# Patient Record
Sex: Male | Born: 1995 | Race: White | Hispanic: Yes | Marital: Single | Smoking: Never smoker
Health system: Southern US, Community
[De-identification: ages and names within clinical notes are randomized; demographics above are authoritative.]

---
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
## 2017-04-28 ENCOUNTER — Encounter (HOSPITAL_COMMUNITY): Payer: Self-pay | Admitting: Emergency Medicine

## 2017-04-28 DIAGNOSIS — L600 Ingrowing nail: Secondary | ICD-10-CM | POA: Diagnosis not present

## 2017-04-28 DIAGNOSIS — Z79899 Other long term (current) drug therapy: Secondary | ICD-10-CM | POA: Diagnosis not present

## 2017-04-28 NOTE — ED Triage Notes (Signed)
Pt presents with L great toe swelling, redness, and warmth with purulent drainage for 4 months worse today; pt suspects ingrown toenail

## 2017-04-29 ENCOUNTER — Emergency Department (HOSPITAL_COMMUNITY)
Admission: EM | Admit: 2017-04-29 | Discharge: 2017-04-29 | Disposition: A | Discharge status: Home / Self Care | Payer: Medicaid Other | Attending: Emergency Medicine | Admitting: Emergency Medicine

## 2017-04-29 DIAGNOSIS — L600 Ingrowing nail: Secondary | ICD-10-CM

## 2017-04-29 MED ORDER — LIDOCAINE HCL 2 % IJ SOLN
20.0000 mL | Freq: Once | INTRAMUSCULAR | Status: AC
  Administered 2017-04-29: 400 mg
  Filled 2017-04-29: qty 20

## 2017-04-29 MED ORDER — CEPHALEXIN 500 MG PO CAPS: 500.0000 mg | ORAL_CAPSULE | Freq: Four times a day (QID) | ORAL | 0 refills | Status: DC

## 2017-04-29 NOTE — ED Provider Notes (Signed)
[facility] Provider Note   CSN: [id number] Arrival date & time: 04/28/17  2247     History   Chief Complaint Chief Complaint  Patient presents with  . Nail Problem    HPI Craig Chavez is a 21 y.o. male.  HPI   21 year old male presents today with complaints of toe pain. Patient reports over the last several months he's had swelling over the left great toe. He notes an ingrown toenail along the medial nail fold with swelling and purulent discharge. He notes minor amount of surrounding redness, denies any extension up into the joint remainder of foot. He denies any other infectious etiology including fever chills nausea or vomiting. No medications prior to arrival. No significant past medical history.  History reviewed. No pertinent past medical history.  There are no active problems to display for this patient.   History reviewed. No pertinent surgical history.     Home Medications    Prior to Admission medications   Medication Sig Start Date End Date Taking? Authorizing Provider  amoxicillin (AMOXIL) 875 MG tablet Take 1 tablet (875 mg total) by mouth 2 (two) times daily. 11/09/16   Deatra Canterxford, William J, FNP  cephALEXin (KEFLEX) 500 MG capsule Take 1 capsule (500 mg total) by mouth 4 (four) times daily. 04/29/17   Rayley Gao, Tinnie GensJeffrey, PA-C  ibuprofen (ADVIL,MOTRIN) 600 MG tablet Take 1 tablet (600 mg total) by mouth every 6 (six) hours as needed. 05/10/16   Maloy, Illene RegulusBrittany Nicole, NP  ipratropium (ATROVENT) 0.06 % nasal spray Place 2 sprays into both nostrils 4 (four) times daily. 11/09/16   Deatra Canterxford, William J, FNP  mupirocin ointment (BACTROBAN) 2 % Apply to affected area BID x 7-10 days 02/09/15   Presson, Mathis FareJennifer Lee H, PA  neomycin-polymyxin-hydrocortisone (CORTISPORIN) 3.5-10000-1 otic suspension Place 4 drops into both ears 3 (three) times daily. 11/09/16   Deatra Canterxford, William J, FNP    Family History History reviewed. No pertinent family history.  Social  History Social History  Substance Use Topics  . Smoking status: Never Smoker  . Smokeless tobacco: Never Used  . Alcohol use No     Allergies   Patient has no known allergies.   Review of Systems Review of Systems  All other systems reviewed and are negative.    Physical Exam Updated Vital Signs BP 121/80 (BP Location: Right Arm)   Pulse 61   Temp 97.8 F (36.6 C) (Oral)   Resp 14   Ht 5\' 6"  (1.676 m)   Wt 78 kg (172 lb)   SpO2 97%   BMI 27.76 kg/m   Physical Exam  Constitutional: He is oriented to person, place, and time. He appears well-developed and well-nourished.  HENT:  Head: Normocephalic and atraumatic.  Eyes: Pupils are equal, round, and reactive to light. Conjunctivae are normal. Right eye exhibits no discharge. Left eye exhibits no discharge. No scleral icterus.  Neck: Normal range of motion. No JVD present. No tracheal deviation present.  Pulmonary/Chest: Effort normal. No stridor.  Musculoskeletal:  Redness and chronic skin changes  noted along the left great toe medial nail fold, ingrown toenail noted very small amount of surrounding redness. No pain to palpation of the plantar aspect of the toe  Neurological: He is alert and oriented to person, place, and time. Coordination normal.  Psychiatric: He has a normal mood and affect. His behavior is normal. Judgment and thought content normal.  Nursing note and vitals reviewed.    ED Treatments / Results  Labs (all labs  ordered are listed, but only abnormal results are displayed) Labs Reviewed - No data to display  EKG  EKG Interpretation None       Radiology No results found.  Procedures .Nail Removal Date/Time: 04/29/2017 6:37 AM Performed by: Curlene DolphinHEDGES, Besan Ketchem Authorized by: Curlene DolphinHEDGES, Elisse Pennick   Consent:    Consent obtained:  Verbal   Consent given by:  Patient   Risks discussed:  Bleeding, incomplete removal, infection, pain and permanent nail deformity   Alternatives discussed:  Referral,  observation, alternative treatment, delayed treatment and no treatment Location:    Foot:  L big toe Pre-procedure details:    Skin preparation:  Betadine Anesthesia (see MAR for exact dosages):    Anesthesia method:  Nerve block   Block needle gauge:  25 G   Block anesthetic:  Lidocaine 1% w/o epi   Block technique:  Lateral    Block outcome:  Anesthesia achieved Nail Removal:    Nail removed:  Complete   Nail bed repaired: no     Removed nail replaced and anchored: no   Ingrown nail:    Wedge excision of skin: no   Post-procedure details:    Dressing:  Antibiotic ointment   Patient tolerance of procedure:  Tolerated well, no immediate complications   (including critical care time)  Medications Ordered in ED Medications  lidocaine (XYLOCAINE) 2 % (with pres) injection 400 mg (400 mg Infiltration Given 04/29/17 0417)     Initial Impression / Assessment and Plan / ED Course  I have reviewed the triage vital signs and the nursing notes.  Pertinent labs & imaging results that were available during my care of the patient were reviewed by me and considered in my medical decision making (see chart for details).      Final Clinical Impressions(s) / ED Diagnoses   Final diagnoses:  Ingrown toenail    21 year old male presents today with ingrown toenail. Patient had significant redness around the ingrown toe, no. Discharge. Wedge resection was done here without complication, patient tolerated this very well. He will placed on antibiotics, he will follow-up as an outpatient with podiatry for further evaluation and management. He is given strict return cautioned, he verbalized understanding and agreement to today's plan had no further questions or concerns at time of discharge.    New Prescriptions Discharge Medication List as of 04/29/2017  5:45 AM    START taking these medications   Details  cephALEXin (KEFLEX) 500 MG capsule Take 1 capsule (500 mg total) by mouth 4 (four)  times daily., Starting Sun 04/29/2017, Print         Jaymison Luber, ChumucklaJeffrey, PA-C 04/29/17 [id number]    Zadie RhineWickline, Donald, MD 04/30/17 [phone]

## 2017-04-29 NOTE — Discharge Instructions (Signed)
Please read attached information. If you experience any new or worsening signs or symptoms please return to the emergency room for evaluation. Please follow-up with your primary care provider or specialist as discussed. Please use medication prescribed only as directed and discontinue taking if you have any concerning signs or symptoms.   °

## 2017-04-30 ENCOUNTER — Telehealth: Payer: Self-pay | Admitting: *Deleted

## 2017-04-30 NOTE — Telephone Encounter (Signed)
Pharmacy called to get permission to fill out of state Rx.

## 2017-10-22 ENCOUNTER — Encounter (HOSPITAL_COMMUNITY): Payer: Self-pay

## 2017-10-22 ENCOUNTER — Ambulatory Visit (HOSPITAL_COMMUNITY)
Admission: EM | Admit: 2017-10-22 | Discharge: 2017-10-22 | Disposition: A | Discharge status: Home / Self Care | Payer: Medicaid Other | Attending: Family Medicine | Admitting: Family Medicine

## 2017-10-22 ENCOUNTER — Other Ambulatory Visit: Payer: Self-pay

## 2017-10-22 DIAGNOSIS — B9789 Other viral agents as the cause of diseases classified elsewhere: Secondary | ICD-10-CM

## 2017-10-22 DIAGNOSIS — R05 Cough: Secondary | ICD-10-CM | POA: Diagnosis not present

## 2017-10-22 DIAGNOSIS — J069 Acute upper respiratory infection, unspecified: Secondary | ICD-10-CM | POA: Diagnosis not present

## 2017-10-22 DIAGNOSIS — J029 Acute pharyngitis, unspecified: Secondary | ICD-10-CM

## 2017-10-22 MED ORDER — HYDROCODONE-HOMATROPINE 5-1.5 MG/5ML PO SYRP: 5.0000 mL | ORAL_SOLUTION | Freq: Four times a day (QID) | ORAL | 0 refills | Status: DC | PRN

## 2017-10-22 NOTE — ED Provider Notes (Signed)
  [facility]   [id number] 10/22/17 Arrival Time: 1903   SUBJECTIVE:  Riki Alteslan Herrera-Miranda is a 22 y.o. male who presents to the urgent care with complaint of sore throat and cough x 2 days.  No fever.  Pain with swallowing.  He works for [employer]  History reviewed. No pertinent past medical history. History reviewed. No pertinent family history. Social History   Socioeconomic History  . Marital status: Single    Spouse name: Not on file  . Number of children: Not on file  . Years of education: Not on file  . Highest education level: Not on file  Social Needs  . Financial resource strain: Not on file  . Food insecurity - worry: Not on file  . Food insecurity - inability: Not on file  . Transportation needs - medical: Not on file  . Transportation needs - non-medical: Not on file  Occupational History  . Not on file  Tobacco Use  . Smoking status: Never Smoker  . Smokeless tobacco: Never Used  Substance and Sexual Activity  . Alcohol use: No  . Drug use: No  . Sexual activity: No  Other Topics Concern  . Not on file  Social History Narrative  . Not on file   No outpatient medications have been marked as taking for the 10/22/17 encounter [facility](Hospital Encounter).   No Known Allergies    ROS: As per HPI, remainder of ROS negative.   OBJECTIVE:   Vitals:   10/22/17 2013  BP: (!) 144/79  Pulse: 61  Resp: 16  Temp: 98.1 F (36.7 C)  TempSrc: Oral  SpO2: 99%     General appearance: alert; no distress Eyes: PERRL; EOMI; conjunctiva normal HENT: normocephalic; atraumatic; TMs normal, canal normal, external ears normal without trauma; nasal mucosa normal; tonsils enlarged bilaterally Neck: supple Lungs: clear to auscultation bilaterally Heart: regular rate and rhythm Back: no CVA tenderness Extremities: no cyanosis or edema; symmetrical with no gross deformities Skin: warm and dry Neurologic: normal gait; grossly normal Psychological:  alert and cooperative; normal mood and affect    Labs:  Results for orders placed or performed during the hospital encounter of 02/02/16  Culture, group A strep  Result Value Ref Range   Specimen Description THROAT    Special Requests NONE    Culture NO GROUP A STREP (S.PYOGENES) ISOLATED    Report Status 02/05/2016 FINAL   POCT rapid strep A [facility]  Result Value Ref Range   Streptococcus, Group A Screen (Direct) NEGATIVE NEGATIVE      ASSESSMENT & PLAN:  1. Viral URI with cough     Meds ordered this encounter  Medications  . HYDROcodone-homatropine (HYCODAN) 5-1.5 MG/5ML syrup    Sig: Take 5 mLs by mouth every 6 (six) hours as needed for cough.    Dispense:  120 mL    Refill:  0    Reviewed expectations re: course of current medical issues. Questions answered. Outlined signs and symptoms indicating need for more acute intervention. Patient verbalized understanding. After Visit Summary given.      Elvina SidleLauenstein, Woodie Degraffenreid, MD 10/22/17 2039

## 2017-10-22 NOTE — Discharge Instructions (Signed)
The strep test is negative and all your symptoms and exam point to a viral infection.  I prescribed a cough medicine that should help control symptoms until your body fights off the viral infection.

## 2017-10-22 NOTE — ED Triage Notes (Signed)
[facility], pt has taken OTC medications but has no relief

## 2020-09-29 DIAGNOSIS — M7918 Myalgia, other site: Secondary | ICD-10-CM | POA: Diagnosis present

## 2020-09-29 DIAGNOSIS — U071 COVID-19: Secondary | ICD-10-CM | POA: Diagnosis not present

## 2020-09-30 ENCOUNTER — Emergency Department (HOSPITAL_COMMUNITY): Payer: Medicaid Other

## 2020-09-30 ENCOUNTER — Encounter (HOSPITAL_COMMUNITY): Payer: Self-pay | Admitting: *Deleted

## 2020-09-30 ENCOUNTER — Other Ambulatory Visit: Payer: Self-pay

## 2020-09-30 ENCOUNTER — Emergency Department (HOSPITAL_COMMUNITY)
Admission: EM | Admit: 2020-09-30 | Discharge: 2020-09-30 | Disposition: A | Discharge status: Home / Self Care | Payer: Medicaid Other | Attending: Emergency Medicine | Admitting: Emergency Medicine

## 2020-09-30 DIAGNOSIS — U071 COVID-19: Secondary | ICD-10-CM

## 2020-09-30 LAB — URINALYSIS, ROUTINE W REFLEX MICROSCOPIC
Bacteria, UA: NONE SEEN
Bilirubin Urine: NEGATIVE
Glucose, UA: NEGATIVE mg/dL
Ketones, ur: NEGATIVE mg/dL
Leukocytes,Ua: NEGATIVE
Nitrite: NEGATIVE
Protein, ur: NEGATIVE mg/dL
Specific Gravity, Urine: 1.02 (ref 1.005–1.030)
pH: 5 (ref 5.0–8.0)

## 2020-09-30 LAB — COMPREHENSIVE METABOLIC PANEL
ALT: 34 U/L (ref 0–44)
AST: 21 U/L (ref 15–41)
Albumin: 4.3 g/dL (ref 3.5–5.0)
Alkaline Phosphatase: 61 U/L (ref 38–126)
Anion gap: 12 (ref 5–15)
BUN: 10 mg/dL (ref 6–20)
CO2: 20 mmol/L — ABNORMAL LOW (ref 22–32)
Calcium: 9.1 mg/dL (ref 8.9–10.3)
Chloride: 102 mmol/L (ref 98–111)
Creatinine, Ser: 0.93 mg/dL (ref 0.61–1.24)
GFR, Estimated: >60 mL/min (ref >60)
Glucose, Bld: 110 mg/dL — ABNORMAL HIGH (ref 70–99)
Potassium: 3.8 mmol/L (ref 3.5–5.1)
Sodium: 134 mmol/L — ABNORMAL LOW (ref 135–145)
Total Bilirubin: 0.8 mg/dL (ref 0.3–1.2)
Total Protein: 7.8 g/dL (ref 6.5–8.1)

## 2020-09-30 LAB — RESP PANEL BY RT-PCR (FLU A&B, COVID) ARPGX2
Influenza A by PCR: NEGATIVE
Influenza B by PCR: NEGATIVE
SARS Coronavirus 2 by RT PCR: POSITIVE — AB

## 2020-09-30 LAB — CBC
HCT: 39.6 % (ref 39.0–52.0)
Hemoglobin: 13.7 g/dL (ref 13.0–17.0)
MCH: 30.6 pg (ref 26.0–34.0)
MCHC: 34.6 g/dL (ref 30.0–36.0)
MCV: 88.6 fL (ref 80.0–100.0)
Platelets: 287 10*3/uL (ref 150–400)
RBC: 4.47 MIL/uL (ref 4.22–5.81)
RDW: 12.2 % (ref 11.5–15.5)
WBC: 8.4 10*3/uL (ref 4.0–10.5)
nRBC: 0 % (ref 0.0–0.2)

## 2020-09-30 LAB — LIPASE, BLOOD: Lipase: 25 U/L (ref 11–51)

## 2020-09-30 MED ORDER — NAPROXEN 500 MG PO TABS: 500.0000 mg | ORAL_TABLET | Freq: Two times a day (BID) | ORAL | 0 refills | Status: DC

## 2020-09-30 MED ORDER — ACETAMINOPHEN 325 MG PO TABS
650.0000 mg | ORAL_TABLET | Freq: Once | ORAL | Status: AC
  Administered 2020-09-30: 650 mg via ORAL
  Filled 2020-09-30: qty 2

## 2020-09-30 MED ORDER — BENZONATATE 100 MG PO CAPS: 100.0000 mg | ORAL_CAPSULE | Freq: Three times a day (TID) | ORAL | 0 refills | Status: DC

## 2020-09-30 NOTE — Discharge Instructions (Addendum)
Your Covid test today was positive. Take the medications as needed to help with your symptoms. Follow-up with the post Covid care clinic listed below. Return to the ER if you start to experience chest pain, shortness of breath, uncontrollable vomiting.

## 2020-09-30 NOTE — ED Triage Notes (Signed)
The pt has had a temp headache body aches for 3 days

## 2020-09-30 NOTE — ED Provider Notes (Signed)
[facility] EMERGENCY DEPARTMENT Provider Note   CSN: [id number] Arrival date & time: 09/29/20  2307     History Chief Complaint  Patient presents with  . Generalized Body Aches    Craig Chavez is a 25 y.o. male who presents to ED with a chief complaint of body aches, cough and diarrhea for the past 3 days.  Denies any known Covid exposures but has not been vaccinated.  He has not been take any medication to help with his symptoms.  Denies any chronic lung disease.  Denies any chest pain, abdominal pain, hemoptysis, leg swelling or vomiting.  HPI     History reviewed. No pertinent past medical history.  There are no problems to display for this patient.   History reviewed. No pertinent surgical history.     No family history on file.  Social History   Tobacco Use  . Smoking status: Never Smoker  . Smokeless tobacco: Never Used  Substance Use Topics  . Alcohol use: No  . Drug use: No    Home Medications Prior to Admission medications   Medication Sig Start Date End Date Taking? Authorizing Provider  benzonatate (TESSALON) 100 MG capsule Take 1 capsule (100 mg total) by mouth every 8 (eight) hours. 09/30/20  Yes Tranell Wojtkiewicz, PA-C  naproxen (NAPROSYN) 500 MG tablet Take 1 tablet (500 mg total) by mouth 2 (two) times daily. 09/30/20  Yes Morton Simson, PA-C  HYDROcodone-homatropine (HYCODAN) 5-1.5 MG/5ML syrup Take 5 mLs by mouth every 6 (six) hours as needed for cough. 10/22/17   Robyn Haber, MD  ipratropium (ATROVENT) 0.06 % nasal spray Place 2 sprays into both nostrils 4 (four) times daily. 11/09/16   Lysbeth Penner, FNP    Allergies    Patient has no known allergies.  Review of Systems   Review of Systems  Constitutional: Positive for chills. Negative for appetite change and fever.  HENT: Negative for ear pain, rhinorrhea, sneezing and sore throat.   Eyes: Negative for photophobia and visual disturbance.  Respiratory: Positive for  cough. Negative for chest tightness, shortness of breath and wheezing.   Cardiovascular: Negative for chest pain and palpitations.  Gastrointestinal: Positive for diarrhea. Negative for abdominal pain, blood in stool, constipation, nausea and vomiting.  Genitourinary: Negative for dysuria, hematuria and urgency.  Musculoskeletal: Positive for myalgias.  Skin: Negative for rash.  Neurological: Negative for dizziness, weakness and light-headedness.    Physical Exam Updated Vital Signs BP 134/76   Pulse 85   Temp 98.5 F (36.9 C) (Oral)   Resp 19   Ht 5\' 6"  (1.676 m)   Wt 88.5 kg   SpO2 98%   BMI 31.47 kg/m   Physical Exam Vitals and nursing note reviewed.  Constitutional:      General: He is not in acute distress.    Appearance: He is well-developed and well-nourished.     Comments: Speaking in complete sentences without difficulty.  HENT:     Head: Normocephalic and atraumatic.     Nose: Nose normal.  Eyes:     General: No scleral icterus.       Left eye: No discharge.     Extraocular Movements: EOM normal.     Conjunctiva/sclera: Conjunctivae normal.  Cardiovascular:     Rate and Rhythm: Normal rate and regular rhythm.     Pulses: Intact distal pulses.     Heart sounds: Normal heart sounds. No murmur heard. No friction rub. No gallop.   Pulmonary:  Effort: Pulmonary effort is normal. No respiratory distress.     Breath sounds: Normal breath sounds.  Abdominal:     General: Bowel sounds are normal. There is no distension.     Palpations: Abdomen is soft.     Tenderness: There is no abdominal tenderness. There is no guarding.  Musculoskeletal:        General: No edema. Normal range of motion.     Cervical back: Normal range of motion and neck supple.  Skin:    General: Skin is warm and dry.     Findings: No rash.  Neurological:     Mental Status: He is alert.     Motor: No abnormal muscle tone.     Coordination: Coordination normal.  Psychiatric:         Mood and Affect: Mood and affect normal.     ED Results / Procedures / Treatments   Labs (all labs ordered are listed, but only abnormal results are displayed) Labs Reviewed  RESP PANEL BY RT-PCR (FLU A&B, COVID) ARPGX2 - Abnormal; Notable for the following components:      Result Value   SARS Coronavirus 2 by RT PCR POSITIVE (*)    All other components within normal limits  COMPREHENSIVE METABOLIC PANEL - Abnormal; Notable for the following components:   Sodium 134 (*)    CO2 20 (*)    Glucose, Bld 110 (*)    All other components within normal limits  URINALYSIS, ROUTINE W REFLEX MICROSCOPIC - Abnormal; Notable for the following components:   Hgb urine dipstick MODERATE (*)    All other components within normal limits  LIPASE, BLOOD  CBC    EKG None  Radiology DG Chest Portable 1 View  Result Date: 09/30/2020 CLINICAL DATA:  COVID.  Shortness of breath. EXAM: PORTABLE CHEST 1 VIEW COMPARISON:  No prior. FINDINGS: Mediastinum and hilar structures normal. Low lung volumes. No focal infiltrate. No pleural effusion or pneumothorax. Heart size normal. No pulmonary venous congestion. No acute bony abnormality. Mild thoracic spine scoliosis. IMPRESSION: Low lung volumes. No acute cardiopulmonary disease identified. Electronically Signed   By: Maisie Fus  Register   On: 09/30/2020 12:58    Procedures Procedures (including critical care time)  Medications Ordered in ED Medications  acetaminophen (TYLENOL) tablet 650 mg (650 mg Oral Given 09/30/20 0138)  acetaminophen (TYLENOL) tablet 650 mg (650 mg Oral Given 09/30/20 0847)    ED Course  I have reviewed the triage vital signs and the nursing notes.  Pertinent labs & imaging results that were available during my care of the patient were reviewed by me and considered in my medical decision making (see chart for details).    MDM Rules/Calculators/A&P                          Craig Chavez was evaluated in Emergency Department on  09/30/20 for the symptoms described in the history of present illness. He/she was evaluated in the context of the global COVID-19 pandemic, which necessitated consideration that the patient might be at risk for infection with the SARS-CoV-2 virus that causes COVID-19. Institutional protocols and algorithms that pertain to the evaluation of patients at risk for COVID-19 are in a state of rapid change based on information released by regulatory bodies including the CDC and federal and state organizations. These policies and algorithms were followed during the patient's care in the ED.  25 year old male presenting to the ED for cough, body aches, diarrhea  and chills for the past 3 days.  He is Covid positive here in the ED.  He denies any known exposures but has not been vaccinated.  CMP, CBC and lipase unremarkable.  He is hemodynamically stable, oxygen saturations above 97% on room air.  Will order chest x-ray and ambulate while checking pulse oximetry and reassess.  Chest x-ray without any acute abnormalities.  Patient ambulating here with oxygen saturations 100% on room air.  Will give information for post Covid care clinic and treat symptomatically.  Patient advised to return for any worsening symptoms.  All imaging, if done today, including plain films, CT scans, and ultrasounds, independently reviewed by me, and interpretations confirmed via formal radiology reads.  Patient is hemodynamically stable, in NAD, and able to ambulate in the ED. Evaluation does not show pathology that would require ongoing emergent intervention or inpatient treatment. I explained the diagnosis to the patient. Pain has been managed and has no complaints prior to discharge. Patient is comfortable with above plan and is stable for discharge at this time. All questions were answered prior to disposition. Strict return precautions for returning to the ED were discussed. Encouraged follow up with PCP.   An After Visit Summary was  printed and given to the patient.   Portions of this note were generated with Scientist, clinical (histocompatibility and immunogenetics). Dictation errors may occur despite best attempts at proofreading.  Final Clinical Impression(s) / ED Diagnoses Final diagnoses:  COVID-19 virus infection    Rx / DC Orders ED Discharge Orders         Ordered    naproxen (NAPROSYN) 500 MG tablet  2 times daily        09/30/20 1324    benzonatate (TESSALON) 100 MG capsule  Every 8 hours        09/30/20 1324           Dietrich Pates, PA-C 09/30/20 1349    Long, Arlyss Repress, MD 10/01/20 [phone]

## 2020-09-30 NOTE — ED Notes (Signed)
Pt ambulated on room air- sats 100%

## 2022-08-21 IMAGING — DX DG CHEST 1V PORT
1 series · 1 of 1 positions shown · non-contrast
Comparison: No prior.

CLINICAL DATA: COVID.  Shortness of breath.

EXAM:
PORTABLE CHEST 1 VIEW

[chest ap]
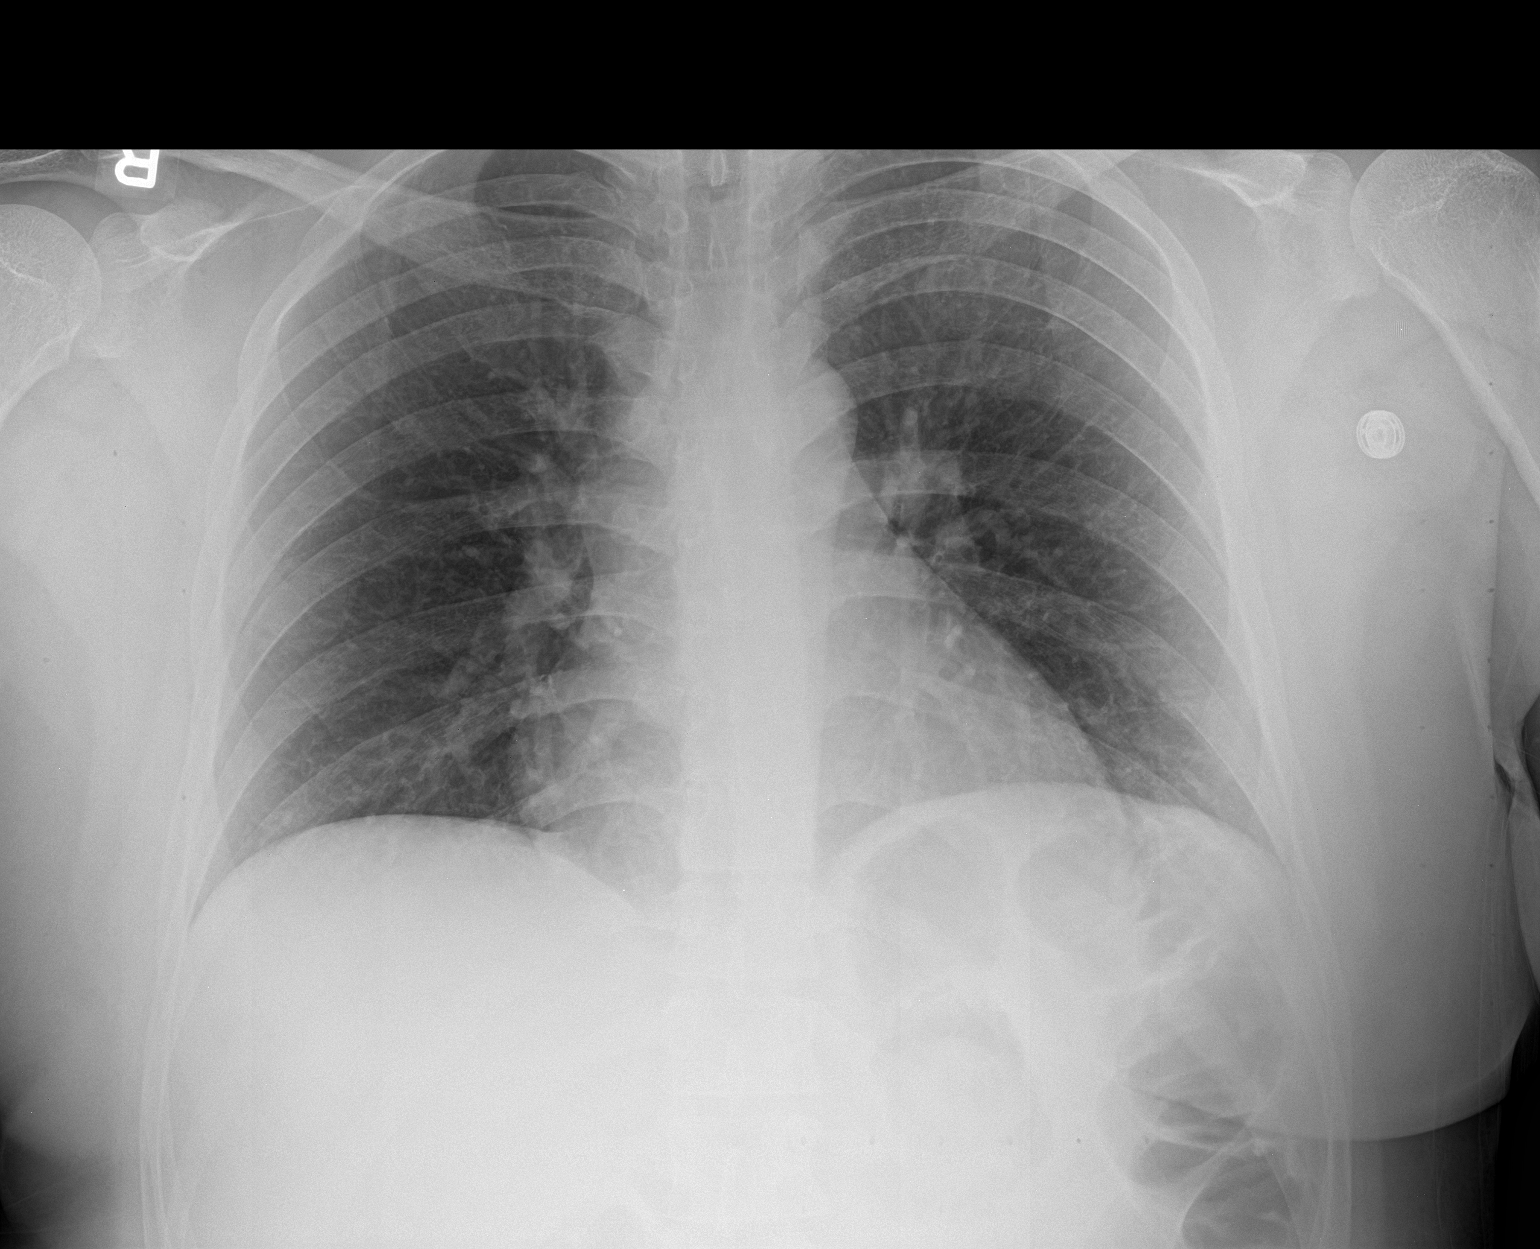

[1 of 1 positions shown; findings below may reference images not displayed]

FINDINGS: Mediastinum and hilar structures normal. Low lung volumes. No focal
infiltrate. No pleural effusion or pneumothorax. Heart size normal.
No pulmonary venous congestion. No acute bony abnormality. Mild
thoracic spine scoliosis.
IMPRESSION: Low lung volumes. No acute cardiopulmonary disease identified.

## 2023-07-06 ENCOUNTER — Emergency Department (HOSPITAL_COMMUNITY)
Admission: EM | Admit: 2023-07-06 | Discharge: 2023-07-07 | Discharge status: Against Medical Advice | Payer: Medicaid Other | Attending: Emergency Medicine | Admitting: Emergency Medicine

## 2023-07-06 ENCOUNTER — Other Ambulatory Visit: Payer: Self-pay

## 2023-07-06 ENCOUNTER — Encounter (HOSPITAL_COMMUNITY): Payer: Self-pay | Admitting: *Deleted

## 2023-07-06 ENCOUNTER — Emergency Department (HOSPITAL_COMMUNITY): Payer: Medicaid Other

## 2023-07-06 DIAGNOSIS — M79675 Pain in left toe(s): Secondary | ICD-10-CM | POA: Diagnosis present

## 2023-07-06 DIAGNOSIS — Z5321 Procedure and treatment not carried out due to patient leaving prior to being seen by health care provider: Secondary | ICD-10-CM | POA: Insufficient documentation

## 2023-07-06 LAB — COMPREHENSIVE METABOLIC PANEL
ALT: 37 U/L (ref 0–44)
AST: 19 U/L (ref 15–41)
Albumin: 4 g/dL (ref 3.5–5.0)
Alkaline Phosphatase: 64 U/L (ref 38–126)
Anion gap: 9 (ref 5–15)
BUN: 10 mg/dL (ref 6–20)
CO2: 23 mmol/L (ref 22–32)
Calcium: 9.3 mg/dL (ref 8.9–10.3)
Chloride: 106 mmol/L (ref 98–111)
Creatinine, Ser: 0.78 mg/dL (ref 0.61–1.24)
GFR, Estimated: >60 mL/min (ref >60)
Glucose, Bld: 124 mg/dL — ABNORMAL HIGH (ref 70–99)
Potassium: 3.7 mmol/L (ref 3.5–5.1)
Sodium: 138 mmol/L (ref 135–145)
Total Bilirubin: 0.5 mg/dL (ref 0.3–1.2)
Total Protein: 7.3 g/dL (ref 6.5–8.1)

## 2023-07-06 LAB — CBC
HCT: 42.4 % (ref 39.0–52.0)
Hemoglobin: 14.1 g/dL (ref 13.0–17.0)
MCH: 29.8 pg (ref 26.0–34.0)
MCHC: 33.3 g/dL (ref 30.0–36.0)
MCV: 89.6 fL (ref 80.0–100.0)
Platelets: 284 10*3/uL (ref 150–400)
RBC: 4.73 MIL/uL (ref 4.22–5.81)
RDW: 12.1 % (ref 11.5–15.5)
WBC: 8.8 10*3/uL (ref 4.0–10.5)
nRBC: 0 % (ref 0.0–0.2)

## 2023-07-06 NOTE — ED Notes (Signed)
Pt going to x-ray from [facility].

## 2023-07-06 NOTE — ED Triage Notes (Signed)
The pt has pain in his lt great toe  he has had the nail removed once because he had an ingrown toenail.Marland Kitchen this time the toenail has been painful for 2 months  the toe is discolored now and more painful

## 2023-07-07 NOTE — ED Notes (Signed)
This pt left said they were just going to go to urgent care in the morning since they had a child at home and couldn't stay much longer

## 2023-07-08 ENCOUNTER — Encounter (HOSPITAL_COMMUNITY): Payer: Self-pay | Admitting: Emergency Medicine

## 2023-07-08 ENCOUNTER — Ambulatory Visit (HOSPITAL_COMMUNITY)
Admission: EM | Admit: 2023-07-08 | Discharge: 2023-07-08 | Disposition: A | Discharge status: Home / Self Care | Payer: Medicaid Other | Attending: Internal Medicine | Admitting: Internal Medicine

## 2023-07-08 DIAGNOSIS — M869 Osteomyelitis, unspecified: Secondary | ICD-10-CM

## 2023-07-08 DIAGNOSIS — L600 Ingrowing nail: Secondary | ICD-10-CM

## 2023-07-08 MED ORDER — DOXYCYCLINE HYCLATE 100 MG PO CAPS
100.0000 mg | ORAL_CAPSULE | Freq: Two times a day (BID) | ORAL | 0 refills | Status: AC
Start: 2023-07-08 — End: 2023-07-22

## 2023-07-08 NOTE — ED Provider Notes (Signed)
[facility]    CSN: [id number] Arrival date & time: 07/08/23  1455      History   Chief Complaint Chief Complaint  Patient presents with   Toe Pain    Left great toe    HPI Craig Chavez is a 27 y.o. male.   Patient presents to urgent care for evaluation of pain to the left great toe that started approximately 1 year ago.  Over the last several days, the pain has intensified and is worse with weightbearing activity/movement of the left great toe.  He states the left great toenail appears ingrown and has been red, swollen, and tender over the last few months.  He has been trying to take out the ingrown toenail himself unsuccessfully at home.  No recent trauma/injuries to the left great toe, history of diabetes, or history of immunosuppression.  Denies fever, chills, nausea, vomiting, and bodyaches.  He went to the emergency department 2 days ago for evaluation of symptoms but left prior to being seen.  An x-ray was performed at the ER and so his blood work.  Blood work was unremarkable.  X-ray resulted and shows concerns for mild osteomyelitis to the distal tuft of the left great toe.  Patient denies recent antibiotic/steroid use.  Using over-the-counter medications to help with pain prior to arrival (ibuprofen and Tylenol) without relief.   Toe Pain    History reviewed. No pertinent past medical history.  There are no problems to display for this patient.   History reviewed. No pertinent surgical history.     Home Medications    Prior to Admission medications   Medication Sig Start Date End Date Taking? Authorizing Provider  doxycycline (VIBRAMYCIN) 100 MG capsule Take 1 capsule (100 mg total) by mouth 2 (two) times daily for 14 days. 07/08/23 07/22/23 Yes Gilberta Peeters, Donavan Burnet, FNP  benzonatate (TESSALON) 100 MG capsule Take 1 capsule (100 mg total) by mouth every 8 (eight) hours. 09/30/20   Khatri, Hina, PA-C  HYDROcodone-homatropine (HYCODAN) 5-1.5  MG/5ML syrup Take 5 mLs by mouth every 6 (six) hours as needed for cough. 10/22/17   Elvina Sidle, MD  ipratropium (ATROVENT) 0.06 % nasal spray Place 2 sprays into both nostrils 4 (four) times daily. 11/09/16   Deatra Canter, FNP  naproxen (NAPROSYN) 500 MG tablet Take 1 tablet (500 mg total) by mouth 2 (two) times daily. 09/30/20   Dietrich Pates, PA-C    Family History History reviewed. No pertinent family history.  Social History Social History   Tobacco Use   Smoking status: Never   Smokeless tobacco: Never  Substance Use Topics   Alcohol use: No   Drug use: No     Allergies   Patient has no known allergies.   Review of Systems Review of Systems Per HPI  Physical Exam Triage Vital Signs ED Triage Vitals  Encounter Vitals Group     BP 07/08/23 1510 126/76     Systolic BP Percentile --      Diastolic BP Percentile --      Pulse Rate 07/08/23 1510 78     Resp 07/08/23 1510 16     Temp 07/08/23 1508 98.2 F (36.8 C)     Temp Source 07/08/23 1508 Oral     SpO2 07/08/23 1510 99 %     Weight --      Height --      Head Circumference --      Peak Flow --  Pain Score 07/08/23 1507 6     Pain Loc --      Pain Education --      Exclude from Growth Chart --    No data found.  Updated Vital Signs BP 126/76   Pulse 78   Temp 98.2 F (36.8 C) (Oral)   Resp 16   SpO2 99%   Visual Acuity Right Eye Distance:   Left Eye Distance:   Bilateral Distance:    Right Eye Near:   Left Eye Near:    Bilateral Near:     Physical Exam Vitals and nursing note reviewed.  Constitutional:      Appearance: He is not ill-appearing or toxic-appearing.  HENT:     Head: Normocephalic and atraumatic.     Right Ear: Hearing and external ear normal.     Left Ear: Hearing and external ear normal.     Nose: Nose normal.     Mouth/Throat:     Lips: Pink.  Eyes:     General: Lids are normal. Vision grossly intact. Gaze aligned appropriately.     Extraocular Movements:  Extraocular movements intact.     Conjunctiva/sclera: Conjunctivae normal.  Pulmonary:     Effort: Pulmonary effort is normal.  Musculoskeletal:     Cervical back: Neck supple.  Feet:     Left foot:     Skin integrity: Skin integrity normal.     Toenail Condition: Left toenails are ingrown.     Comments: Ingrown toenail to left medial and lateral great toe. Less than 2 cap refill. Evidence of purulent drainage present to the lateral nail fold. Ambulatory with steady gait without difficulty.  Skin:    General: Skin is warm and dry.     Capillary Refill: Capillary refill takes less than 2 seconds.     Findings: No rash.  Neurological:     General: No focal deficit present.     Mental Status: He is alert and oriented to person, place, and time. Mental status is at baseline.     Cranial Nerves: No dysarthria or facial asymmetry.  Psychiatric:        Mood and Affect: Mood normal.        Speech: Speech normal.        Behavior: Behavior normal.        Thought Content: Thought content normal.        Judgment: Judgment normal.      UC Treatments / Results  Labs (all labs ordered are listed, but only abnormal results are displayed) Labs Reviewed - No data to display  EKG   Radiology DG Toe Great Left  Result Date: 07/07/2023 CLINICAL DATA:  Toe infection EXAM: LEFT GREAT TOE COMPARISON:  None Available. FINDINGS: No malalignment. No soft tissue emphysema. Possible erosion at the lateral and volar aspect of the tuft of first distal phalanx. Punctate densities at the nail bed. IMPRESSION: Possible erosion at the tuft of first distal phalanx, difficult to exclude osteomyelitis. Consider correlation with MRI Electronically Signed   By: Jasmine Pang M.D.   On: 07/07/2023 00:17    Procedures Procedures (including critical care time)  Medications Ordered in UC Medications - No data to display  Initial Impression / Assessment and Plan / UC Course  I have reviewed the triage vital  signs and the nursing notes.  Pertinent labs & imaging results that were available during my care of the patient were reviewed by me and considered in my medical decision making (see  chart for details).   1.  Osteomyelitis of great toe of left foot, ingrown toenail of left foot with infection Reviewed blood work and x-ray performed in the emergency department yesterday.  Blood work is stable and without leukocytosis.  X-ray of left foot shows bony changes to the left great toe suspicious for osteomyelitis. Reviewed images with Doristine Mango PA-C who recommends treatment with doxycycline twice daily for the next 2-6 weeks.  I will go ahead and prescribe doxycycline to be taken twice daily for 2 weeks.   Patient does not currently have a PCP and has medicaid, therefore will need referral to wound care and/or  [facility] for ongoing evaluation and management of this. Radiology recommends MRI for correlation, he may have this performed outpatient.   No current indication to send him back to the ED for IV antibiotics as he is stable and without signs of systemic infection. Strict ER return precautions discussed (fever, chills, paresthesias, injury to the toe, etc).   No lymphangitis or streaking on exam and no systemic symptoms/signs of infection.  Counseled patient on potential for adverse effects with medications prescribed/recommended today, strict ER and return-to-clinic precautions discussed, patient verbalized understanding.    Final Clinical Impressions(s) / UC Diagnoses   Final diagnoses:  Osteomyelitis of great toe of left foot (HCC)  Ingrown toenail of left foot with infection     Discharge Instructions      You have an infection of your left great toe. Start taking doxycycline antibiotic twice daily for the next 2 weeks. Ultimately, you will need this treatment for approximately 6 weeks. We will start off with 2 weeks of treatment. I would like for you to get in with  wound care and/or [facility]. Since you have Medicaid, we will need to get you a primary care provider so that you are able to get a referral from your PCP. You may follow-up with urgent care in the meantime as needed.  If you start experiencing severe/worsening redness, swelling, pus coming out of the toe, or fever/chills, please go to the nearest emergency department for further evaluation.    ED Prescriptions     Medication Sig Dispense Auth. Provider   doxycycline (VIBRAMYCIN) 100 MG capsule Take 1 capsule (100 mg total) by mouth 2 (two) times daily for 14 days. 28 capsule Carlisle Beers, FNP      PDMP not reviewed this encounter.   Carlisle Beers, [place] 07/16/23 2125

## 2023-07-08 NOTE — ED Triage Notes (Signed)
Patient has pain in left great toe, has redness and edema. He went to the ER this past Friday but he left because the wait was too long. Rates pain 7/10 currently.

## 2023-07-08 NOTE — Discharge Instructions (Addendum)
You have an infection of your left great toe. Start taking doxycycline antibiotic twice daily for the next 2 weeks. Ultimately, you will need this treatment for approximately 6 weeks. We will start off with 2 weeks of treatment. I would like for you to get in with wound care and/or [facility]. Since you have Medicaid, we will need to get you a primary care provider so that you are able to get a referral from your PCP. You may follow-up with urgent care in the meantime as needed.  If you start experiencing severe/worsening redness, swelling, pus coming out of the toe, or fever/chills, please go to the nearest emergency department for further evaluation.

## 2023-07-10 ENCOUNTER — Encounter: Payer: Self-pay | Admitting: Podiatry

## 2023-07-10 ENCOUNTER — Ambulatory Visit (INDEPENDENT_AMBULATORY_CARE_PROVIDER_SITE_OTHER): Payer: Medicaid Other | Admitting: Podiatry

## 2023-07-10 DIAGNOSIS — M86172 Other acute osteomyelitis, left ankle and foot: Secondary | ICD-10-CM

## 2023-07-10 DIAGNOSIS — L600 Ingrowing nail: Secondary | ICD-10-CM | POA: Diagnosis not present

## 2023-07-10 DIAGNOSIS — M869 Osteomyelitis, unspecified: Secondary | ICD-10-CM

## 2023-07-10 NOTE — Patient Instructions (Addendum)

## 2023-07-11 ENCOUNTER — Encounter: Payer: Self-pay | Admitting: Podiatry

## 2023-07-11 NOTE — Progress Notes (Signed)
  Subjective:  Patient ID: Craig Chavez, male    DOB: Feb 21, 1996,  MRN: [id number]  Chief Complaint  Patient presents with   Ingrown Toenail    Patient is here for ingrown of left hallux    27 y.o. male presents with the above complaint. History confirmed with patient.  This has been going on on and off for couple of months, it got really bad last week and he went to the emergency room but the wait was quite long, they took an x-ray while he was waiting.  He received a call that the x-ray was concerning for osteomyelitis and so he returned to urgent care on Sunday they put him on antibiotics  Objective:  Physical Exam: warm, good capillary refill, no trophic changes or ulcerative lesions, normal DP and PT pulses, normal sensory exam, and a ingrown toenail hallux left lateral border, mild paronychia, no cellulitis purulence or exposed bone  Radiographs: Multiple views x-ray of the left foot: X-ray report from radiographs 07/05/2013 show concern for erosion of the distal tuft of the lateral portion of the distal phalanx, I am unable to appreciate this myself Assessment:   1. Ingrowing left great toenail   2. Osteomyelitis of great toe of left foot (HCC)      Plan:  Patient was evaluated and treated and all questions answered.    Ingrown Nail, left -I am doubtful that he has significant osteomyelitis, however considering the radiographs I did recommend follow-up on the radiograph with a MRI to evaluate for the possibility of osteomyelitis which is unlikely.  He should continue to utilize the doxycycline until he has completed the course -Patient elects to proceed with minor surgery to remove ingrown toenail today. Consent reviewed and signed by patient. -Ingrown nail excised. See procedure note. -Educated on post-procedure care including soaking. Written instructions provided and reviewed.   Procedure: Excision of Ingrown Toenail Location: Left 1st toe lateral nail  borders. Anesthesia: Lidocaine 1% plain; 1.5 mL and Marcaine 0.5% plain; 1.5 mL, digital block. Skin Prep: Betadine. Dressing: Silvadene; telfa; dry, sterile, compression dressing. Technique: Following skin prep, the toe was exsanguinated and a tourniquet was secured at the base of the toe. The affected nail border was freed, split with a nail splitter, and excised. Chemical matrixectomy was then performed with phenol and irrigated out with alcohol. The tourniquet was then removed and sterile dressing applied. Disposition: Patient tolerated procedure well.    Return for after MRI to review.

## 2023-07-12 ENCOUNTER — Ambulatory Visit
Admission: RE | Admit: 2023-07-12 | Discharge: 2023-07-12 | Disposition: A | Discharge status: Home / Self Care | Payer: Medicaid Other | Source: Ambulatory Visit | Attending: Podiatry | Admitting: Podiatry

## 2023-07-12 DIAGNOSIS — M869 Osteomyelitis, unspecified: Secondary | ICD-10-CM

## 2023-07-17 ENCOUNTER — Ambulatory Visit: Payer: Medicaid Other | Admitting: Family Medicine

## 2023-07-23 ENCOUNTER — Telehealth: Payer: Self-pay | Admitting: Podiatry

## 2023-07-23 NOTE — Telephone Encounter (Signed)
Called patient and asked if he could come in tomorrow at 415pm and he said he could not come tomorrow so I scheduled him for 11/4.

## 2023-07-23 NOTE — Telephone Encounter (Signed)
-----   Message from Craig Chavez sent at 07/23/2023  8:31 AM EDT ----- Can you have him scheduled to see me for a F/U tomorrow on 10/29?

## 2023-07-30 ENCOUNTER — Encounter: Payer: Self-pay | Admitting: Podiatry

## 2023-07-30 ENCOUNTER — Ambulatory Visit (INDEPENDENT_AMBULATORY_CARE_PROVIDER_SITE_OTHER): Payer: Medicaid Other | Admitting: Podiatry

## 2023-07-30 DIAGNOSIS — M86172 Other acute osteomyelitis, left ankle and foot: Secondary | ICD-10-CM | POA: Diagnosis not present

## 2023-07-30 DIAGNOSIS — M869 Osteomyelitis, unspecified: Secondary | ICD-10-CM

## 2023-07-31 ENCOUNTER — Encounter: Payer: Self-pay | Admitting: Podiatry

## 2023-07-31 NOTE — Progress Notes (Signed)
Subjective:  Patient ID: Craig Chavez, male    DOB: [birth year],  MRN: [id number]  Chief Complaint  Patient presents with   Nail Problem    "It's way better than it was."    27 y.o. male presents with the above complaint. History confirmed with patient.  He says it is doing much better he returns for follow-up today with completion of his MRI  Objective:  Physical Exam: warm, good capillary refill, no trophic changes or ulcerative lesions, normal DP and PT pulses, normal sensory exam, and a ingrown nail removal site is healing well without active signs of clinical infection  Radiographs: Multiple views x-ray of the left foot: X-ray report from radiographs 07/05/2013 show concern for erosion of the distal tuft of the lateral portion of the distal phalanx, I am unable to appreciate this myself  Study Result  Narrative & Impression  CLINICAL DATA:  Ingrown toenail at great toe of left foot. Possible infection. Osteomyelitis suspected.   EXAM: MRI OF THE LEFT TOES WITHOUT CONTRAST   TECHNIQUE: Multiplanar, multisequence MR imaging of the left toes was performed. No intravenous contrast was administered.   COMPARISON:  left great toe radiographs 07/06/2023   FINDINGS: Bones/Joint/Cartilage   In the region of the fluid at the base of the great toe nail bed and edema and swelling within the lateral aspect of the great toe, there is also focal marrow edema within the distal aspect of the distal phalanx of the great toe (axial series 7 images 39 and 40, sagittal series 9 images 22 through 24, coronal series 8 images 8 and 9). Mild overlying plantar lateral cortical erosion (axial series 7 images 38 through 40). This is suspicious for early acute osteomyelitis. Possible minimal dorsal lateral cortical erosion of the more proximal aspect of the distal phalanx on axial series 7, image 36. Note is made mild erosion was also seen within the plantar lateral aspect of the  slightly distal aspect of the distal phalanx of the great toe on prior radiographs.   Ligaments   The Lisfranc ligament complex is intact. The collateral ligaments are intact. The great toe sesamoid pharyngeal ligaments are intact.   Muscles and Tendons   Normal size and signal of the regional musculature. The tendons are intact.   Soft tissues   There is focal fluid bright signal at the proximal aspect of the great toe nailbed (sagittal series 9, images 23-26). There is mild-to-moderate edema and swelling within the adjacent dorsal medial great toe soft tissues (axial series 7 images 36 through 42.   IMPRESSION: 1. Focal fluid bright signal at the proximal aspect of the great toe nailbed with mild-to-moderate edema and swelling within the adjacent dorsal medial great toe soft tissues consistent with soft tissue infection. 2. Focal marrow edema within the distal lateral aspect of the distal phalanx of the great toe with mild overlying plantar lateral cortical erosion. This is suspicious for early acute osteomyelitis.     Electronically Signed   By: Neita Garnet M.D.   On: 07/12/2023 11:22   Assessment:   1. Osteomyelitis of great toe of left foot (HCC)      Plan:  Patient was evaluated and treated and all questions answered.    Osteomyelitis of left great toe We reviewed the results of the MRI which is somewhat surprising considering his clinical appearance but I reviewed the images personally and do agree with the impression that there is marrow edema and cortical destruction at the distal lateral portions  of the tuft.  I recommended biopsy and culture of the area to guide further treatment.  We discussed the risks and benefits of this as well as alternative treatment options.  We discussed the risk of spread of infection if there is osteomyelitis present.  We discussed the postoperative healing process and the need for protected weightbearing in a surgical shoe for  approximately 2 weeks.  Hopefully should be able to return to work after 1 week as long as his sutures are healing well.  I will see him this Friday or next Friday for surgical intervention.   No follow-ups on file.

## 2023-08-01 ENCOUNTER — Telehealth: Payer: Self-pay | Admitting: Podiatry

## 2023-08-01 NOTE — Telephone Encounter (Signed)
DOS- 08/03/2023  BONE BIOPSY 1ST LT-20240  [facility] EFFECTIVE DATE-03/26/2023  DEDUCTIBLE- $0.00 WITH REMAINING $0.00 OOP-Member's individual out-of-pocket maximum has no limit. COINSURANCE- 0%  PER THE [employer] WEBSITE PORTAL, PRIOR AUTH HAS BEEN APPROVED FOR CPT CODE 16109.  GOOD FROM 08/03/2023 - 11/01/2023.  AUTHORIZATION REFERENCE NUMBER: [id number]

## 2023-08-03 ENCOUNTER — Other Ambulatory Visit: Payer: Self-pay | Admitting: Podiatry

## 2023-08-03 DIAGNOSIS — M86172 Other acute osteomyelitis, left ankle and foot: Secondary | ICD-10-CM | POA: Diagnosis not present

## 2023-08-03 MED ORDER — TRAMADOL HCL 50 MG PO TABS
50.0000 mg | ORAL_TABLET | Freq: Four times a day (QID) | ORAL | 0 refills | Status: AC | PRN
Start: 2023-08-03 — End: 2023-08-08

## 2023-08-03 MED ORDER — TRAMADOL HCL 50 MG PO TABS: 50.0000 mg | ORAL_TABLET | Freq: Four times a day (QID) | ORAL | 0 refills | Status: DC | PRN

## 2023-08-03 MED ORDER — DOXYCYCLINE HYCLATE 100 MG PO TABS: 100.0000 mg | ORAL_TABLET | Freq: Two times a day (BID) | ORAL | 0 refills | Status: DC

## 2023-08-03 MED ORDER — IBUPROFEN 800 MG PO TABS
800.0000 mg | ORAL_TABLET | Freq: Three times a day (TID) | ORAL | 0 refills | Status: DC | PRN
Start: 2023-08-03 — End: 2023-08-03

## 2023-08-03 MED ORDER — IBUPROFEN 800 MG PO TABS: 800.0000 mg | ORAL_TABLET | Freq: Three times a day (TID) | ORAL | 0 refills | Status: AC | PRN

## 2023-08-03 MED ORDER — DOXYCYCLINE HYCLATE 100 MG PO TABS
100.0000 mg | ORAL_TABLET | Freq: Two times a day (BID) | ORAL | 0 refills | Status: DC
Start: 2023-08-03 — End: 2023-10-01

## 2023-08-03 NOTE — Addendum Note (Signed)
Addended byLilian Kapur, Muneer Leider R on: 08/03/2023 10:46 AM   Modules accepted: Orders

## 2023-08-03 NOTE — Progress Notes (Signed)
11/8 L great toe bone biopsy

## 2023-08-09 ENCOUNTER — Ambulatory Visit (INDEPENDENT_AMBULATORY_CARE_PROVIDER_SITE_OTHER): Payer: Medicaid Other | Admitting: Podiatry

## 2023-08-09 DIAGNOSIS — M869 Osteomyelitis, unspecified: Secondary | ICD-10-CM

## 2023-08-09 NOTE — Progress Notes (Signed)
Subjective:  Patient ID: Craig Chavez, male    DOB: 1995/11/02,  MRN: [id number]  Chief Complaint  Patient presents with   Routine Post Op    POV #1 DOS 08/03/2023 BIOPSY OF LT GREAT TOE Patient states is doing better no pain at this time no signs of infection.    27 y.o. male presents with the above complaint. History confirmed with patient.  He returns for follow-up has not had pain  Objective:  Physical Exam: warm, good capillary refill, no trophic changes or ulcerative lesions, normal DP and PT pulses, normal sensory exam, and sutures healing.  Onychomycosis present.  No signs of active infection currently.  Radiographs: Multiple views x-ray of the left foot: X-ray report from radiographs 07/05/2013 show concern for erosion of the distal tuft of the lateral portion of the distal phalanx, I am unable to appreciate this myself  Study Result  Narrative & Impression  CLINICAL DATA:  Ingrown toenail at great toe of left foot. Possible infection. Osteomyelitis suspected.   EXAM: MRI OF THE LEFT TOES WITHOUT CONTRAST   TECHNIQUE: Multiplanar, multisequence MR imaging of the left toes was performed. No intravenous contrast was administered.   COMPARISON:  left great toe radiographs 07/06/2023   FINDINGS: Bones/Joint/Cartilage   In the region of the fluid at the base of the great toe nail bed and edema and swelling within the lateral aspect of the great toe, there is also focal marrow edema within the distal aspect of the distal phalanx of the great toe (axial series 7 images 39 and 40, sagittal series 9 images 22 through 24, coronal series 8 images 8 and 9). Mild overlying plantar lateral cortical erosion (axial series 7 images 38 through 40). This is suspicious for early acute osteomyelitis. Possible minimal dorsal lateral cortical erosion of the more proximal aspect of the distal phalanx on axial series 7, image 36. Note is made mild erosion was also seen within  the plantar lateral aspect of the slightly distal aspect of the distal phalanx of the great toe on prior radiographs.   Ligaments   The Lisfranc ligament complex is intact. The collateral ligaments are intact. The great toe sesamoid pharyngeal ligaments are intact.   Muscles and Tendons   Normal size and signal of the regional musculature. The tendons are intact.   Soft tissues   There is focal fluid bright signal at the proximal aspect of the great toe nailbed (sagittal series 9, images 23-26). There is mild-to-moderate edema and swelling within the adjacent dorsal medial great toe soft tissues (axial series 7 images 36 through 42.   IMPRESSION: 1. Focal fluid bright signal at the proximal aspect of the great toe nailbed with mild-to-moderate edema and swelling within the adjacent dorsal medial great toe soft tissues consistent with soft tissue infection. 2. Focal marrow edema within the distal lateral aspect of the distal phalanx of the great toe with mild overlying plantar lateral cortical erosion. This is suspicious for early acute osteomyelitis.     Electronically Signed   By: Neita Garnet M.D.   On: 07/12/2023 11:22   Assessment:   1. Osteomyelitis of great toe of left foot (HCC)      Plan:  Patient was evaluated and treated and all questions answered.    Osteomyelitis of left great toe Pathology results are still pending as of this morning.  I will follow-up on this later today and again tomorrow hopefully should have some at least initial culture data if not the pathology  report.  If there is osteomyelitis present in the pathology or positive bone culture then we will plan to treat with assistance from infectious disease which I will refer to.  If he does not have osteomyelitis then should not need further antibiotic treatment.  We will plan to treat his onychomycosis if that is the case to prevent further nail damage and nail complications.  I will follow-up  with him in 2 weeks for suture removal he may be weightbearing as tolerated May return to work in 10 days and may begin showering tomorrow  No follow-ups on file.

## 2023-08-20 ENCOUNTER — Encounter: Payer: Self-pay | Admitting: Podiatry

## 2023-08-27 ENCOUNTER — Encounter: Payer: Self-pay | Admitting: Podiatry

## 2023-08-27 ENCOUNTER — Ambulatory Visit (INDEPENDENT_AMBULATORY_CARE_PROVIDER_SITE_OTHER): Payer: Self-pay | Admitting: Podiatry

## 2023-08-27 DIAGNOSIS — B351 Tinea unguium: Secondary | ICD-10-CM

## 2023-08-27 MED ORDER — TERBINAFINE HCL 250 MG PO TABS
250.0000 mg | ORAL_TABLET | Freq: Every day | ORAL | 0 refills | Status: DC
Start: 2023-08-27 — End: 2023-10-01

## 2023-08-27 NOTE — Progress Notes (Signed)
Subjective:  Patient ID: Craig Chavez, male    DOB: [birth year],  MRN: [id number]  Chief Complaint  Patient presents with   Routine Post Op    "It's good."    27 y.o. male presents with the above complaint. History confirmed with patient.  Doing well still having any pain  Objective:  Physical Exam: warm, good capillary refill, no trophic changes or ulcerative lesions, normal DP and PT pulses, normal sensory exam, and sutures are well-healed.  Onychomycosis present.  No signs of active infection currently.  Radiographs: Multiple views x-ray of the left foot: X-ray report from radiographs 07/05/2013 show concern for erosion of the distal tuft of the lateral portion of the distal phalanx, I am unable to appreciate this myself  Study Result  Narrative & Impression  CLINICAL DATA:  Ingrown toenail at great toe of left foot. Possible infection. Osteomyelitis suspected.   EXAM: MRI OF THE LEFT TOES WITHOUT CONTRAST   TECHNIQUE: Multiplanar, multisequence MR imaging of the left toes was performed. No intravenous contrast was administered.   COMPARISON:  left great toe radiographs 07/06/2023   FINDINGS: Bones/Joint/Cartilage   In the region of the fluid at the base of the great toe nail bed and edema and swelling within the lateral aspect of the great toe, there is also focal marrow edema within the distal aspect of the distal phalanx of the great toe (axial series 7 images 39 and 40, sagittal series 9 images 22 through 24, coronal series 8 images 8 and 9). Mild overlying plantar lateral cortical erosion (axial series 7 images 38 through 40). This is suspicious for early acute osteomyelitis. Possible minimal dorsal lateral cortical erosion of the more proximal aspect of the distal phalanx on axial series 7, image 36. Note is made mild erosion was also seen within the plantar lateral aspect of the slightly distal aspect of the distal phalanx of the great toe on prior  radiographs.   Ligaments   The Lisfranc ligament complex is intact. The collateral ligaments are intact. The great toe sesamoid pharyngeal ligaments are intact.   Muscles and Tendons   Normal size and signal of the regional musculature. The tendons are intact.   Soft tissues   There is focal fluid bright signal at the proximal aspect of the great toe nailbed (sagittal series 9, images 23-26). There is mild-to-moderate edema and swelling within the adjacent dorsal medial great toe soft tissues (axial series 7 images 36 through 42.   IMPRESSION: 1. Focal fluid bright signal at the proximal aspect of the great toe nailbed with mild-to-moderate edema and swelling within the adjacent dorsal medial great toe soft tissues consistent with soft tissue infection. 2. Focal marrow edema within the distal lateral aspect of the distal phalanx of the great toe with mild overlying plantar lateral cortical erosion. This is suspicious for early acute osteomyelitis.     Electronically Signed   By: Neita Garnet M.D.   On: 07/12/2023 11:22        Assessment:   1. Onychomycosis      Plan:  Patient was evaluated and treated and all questions answered.    Osteomyelitis of left great toe We reviewed the final results of his pathology and cultures that there is no growth in the bone which was biopsied which I do believe was an accurate sample was free of osteomyelitis.  Does not need further treatment for the toe at this point.  He would like to have the onychomycosis treated.  We discussed  oral medications.  Lamisil Rx sent to pharmacy we discussed possible side effects.  Return in 4 months to reevaluate.  Return in about 4 months (around 12/26/2023) for follow up after nail fungus treatment.

## 2023-09-13 ENCOUNTER — Encounter: Payer: Medicaid Other | Admitting: Podiatry

## 2023-10-01 ENCOUNTER — Ambulatory Visit (HOSPITAL_COMMUNITY)
Admission: EM | Admit: 2023-10-01 | Discharge: 2023-10-01 | Disposition: A | Discharge status: Home / Self Care | Payer: Self-pay | Attending: Family Medicine | Admitting: Family Medicine

## 2023-10-01 ENCOUNTER — Other Ambulatory Visit: Payer: Self-pay

## 2023-10-01 ENCOUNTER — Encounter (HOSPITAL_COMMUNITY): Payer: Self-pay | Admitting: Emergency Medicine

## 2023-10-01 DIAGNOSIS — J019 Acute sinusitis, unspecified: Secondary | ICD-10-CM

## 2023-10-01 DIAGNOSIS — J029 Acute pharyngitis, unspecified: Secondary | ICD-10-CM

## 2023-10-01 MED ORDER — CEFDINIR 300 MG PO CAPS: 600.0000 mg | ORAL_CAPSULE | Freq: Every day | ORAL | 0 refills | Status: AC

## 2023-10-01 MED ORDER — FLUTICASONE PROPIONATE 50 MCG/ACT NA SUSP: 2.0000 | Freq: Every day | NASAL | 0 refills | Status: AC

## 2023-10-01 NOTE — Discharge Instructions (Signed)
 Take cefdinir  300 mg--2 capsules together daily for 10 days.  Still change out your toothbrush 2 to 3 days into your antibiotic course  Fluticasone /Flonase  nose spray--put 2 sprays in each nostril once daily  You can also rinse your nose with saline spray.  NyQuil/DayQuil can help for congestion.

## 2023-10-01 NOTE — ED Triage Notes (Signed)
 Symptoms started one week ago.  Bilateral ear popping, sore throat, cough, and brown/green phlegm.    Has taken tylenol, theraflu, and tea.

## 2023-10-01 NOTE — ED Provider Notes (Signed)
 [facility]    CSN: [id number] Arrival date & time: 10/01/23  1546      History   Chief Complaint No chief complaint on file.   HPI Craig Chavez is a 28 y.o. male.   HPI Here for sore throat, nasal congestion and postnasal drainage, and cough.  He has also had some right ear pain and is ears are popping.  No fever noted   no shortness of breath or wheezing.  No vomiting or diarrhea.    NKDA  No known exposures   History reviewed. No pertinent past medical history.  There are no active problems to display for this patient.   History reviewed. No pertinent surgical history.     Home Medications    Prior to Admission medications   Medication Sig Start Date End Date Taking? Authorizing Provider  cefdinir  (OMNICEF ) 300 MG capsule Take 2 capsules (600 mg total) by mouth daily for 10 days. 10/01/23 10/11/23 Yes Vonna Sharlet POUR, MD  fluticasone  (FLONASE ) 50 MCG/ACT nasal spray Place 2 sprays into both nostrils daily. 10/01/23  Yes Vonna Sharlet POUR, MD    Family History History reviewed. No pertinent family history.  Social History Social History   Tobacco Use   Smoking status: Never   Smokeless tobacco: Never  Vaping Use   Vaping status: Never Used  Substance Use Topics   Alcohol use: Yes   Drug use: No     Allergies   Patient has no known allergies.   Review of Systems Review of Systems   Physical Exam Triage Vital Signs ED Triage Vitals  Encounter Vitals Group     BP 10/01/23 1833 122/75     Systolic BP Percentile --      Diastolic BP Percentile --      Pulse Rate 10/01/23 1833 72     Resp 10/01/23 1833 18     Temp 10/01/23 1833 97.9 F (36.6 C)     Temp Source 10/01/23 1833 Oral     SpO2 10/01/23 1833 96 %     Weight --      Height --      Head Circumference --      Peak Flow --      Pain Score 10/01/23 1831 8     Pain Loc --      Pain Education --      Exclude from Growth Chart --    No data found.  Updated  Vital Signs BP 122/75 (BP Location: Right Arm)   Pulse 72   Temp 97.9 F (36.6 C) (Oral)   Resp 18   SpO2 96%   Visual Acuity Right Eye Distance:   Left Eye Distance:   Bilateral Distance:    Right Eye Near:   Left Eye Near:    Bilateral Near:     Physical Exam Vitals reviewed.  Constitutional:      General: He is not in acute distress.    Appearance: He is not toxic-appearing.  HENT:     Right Ear: Ear canal normal.     Left Ear: Tympanic membrane and ear canal normal.     Ears:     Comments: Right tympanic membrane is pink and dull    Nose: Nose normal.     Mouth/Throat:     Mouth: Mucous membranes are moist.     Comments: Tonsils are 2+ hypertrophy but some white exudate bilaterally.  Uvula is in the midline   Eyes:  Extraocular Movements: Extraocular movements intact.     Conjunctiva/sclera: Conjunctivae normal.     Pupils: Pupils are equal, round, and reactive to light.  Cardiovascular:     Rate and Rhythm: Normal rate and regular rhythm.     Heart sounds: No murmur heard. Pulmonary:     Effort: Pulmonary effort is normal. No respiratory distress.     Breath sounds: Normal breath sounds. No stridor. No wheezing, rhonchi or rales.  Abdominal:     Hernia: Hernia: .urg.  Musculoskeletal:     Cervical back: Neck supple.  Lymphadenopathy:     Cervical: No cervical adenopathy.  Skin:    Capillary Refill: Capillary refill takes less than 2 seconds.     Coloration: Skin is not jaundiced or pale.  Neurological:     General: No focal deficit present.     Mental Status: He is alert and oriented to person, place, and time.  Psychiatric:        Behavior: Behavior normal.      UC Treatments / Results  Labs (all labs ordered are listed, but only abnormal results are displayed) Labs Reviewed - No data to display  EKG   Radiology No results found.  Procedures Procedures (including critical care time)  Medications Ordered in UC Medications - No data  to display  Initial Impression / Assessment and Plan / UC Course  I have reviewed the triage vital signs and the nursing notes.  Pertinent labs & imaging results that were available during my care of the patient were reviewed by me and considered in my medical decision making (see chart for details).     I am going to treat for sinusitis and acute pharyngitis.  I will go ahead and treat for 10 days in case this were to be strep.  Flonase  is also sent in and I have recommended saline nasal spray. Final Clinical Impressions(s) / UC Diagnoses   Final diagnoses:  Acute sinusitis, recurrence not specified, unspecified location  Acute pharyngitis, unspecified etiology     Discharge Instructions      Take cefdinir  300 mg--2 capsules together daily for 10 days.  Still change out your toothbrush 2 to 3 days into your antibiotic course  Fluticasone /Flonase  nose spray--put 2 sprays in each nostril once daily  You can also rinse your nose with saline spray.  NyQuil/DayQuil can help for congestion.      ED Prescriptions     Medication Sig Dispense Auth. Provider   cefdinir  (OMNICEF ) 300 MG capsule Take 2 capsules (600 mg total) by mouth daily for 10 days. 20 capsule Rachell Druckenmiller K, MD   fluticasone  (FLONASE ) 50 MCG/ACT nasal spray Place 2 sprays into both nostrils daily. 16 g Vonna Sharlet POUR, MD      PDMP not reviewed this encounter.   Vonna Sharlet POUR, MD 10/01/23 1910

## 2023-12-27 ENCOUNTER — Ambulatory Visit: Payer: Medicaid Other | Admitting: Podiatry

## 2024-04-08 ENCOUNTER — Ambulatory Visit (INDEPENDENT_AMBULATORY_CARE_PROVIDER_SITE_OTHER): Payer: Self-pay | Admitting: Podiatry

## 2024-04-08 ENCOUNTER — Encounter: Payer: Self-pay | Admitting: Podiatry

## 2024-04-08 DIAGNOSIS — L600 Ingrowing nail: Secondary | ICD-10-CM

## 2024-04-08 MED ORDER — NEOMYCIN-POLYMYXIN-HC 3.5-10000-1 OT SUSP
OTIC | 0 refills | Status: AC
Start: 2024-04-08 — End: ?

## 2024-04-08 NOTE — Patient Instructions (Signed)

## 2024-04-10 ENCOUNTER — Encounter: Payer: Self-pay | Admitting: Podiatry

## 2024-04-10 NOTE — Progress Notes (Signed)
  Subjective:  Patient ID: Craig Chavez, male    DOB: [birth year],  MRN: [id number]  Chief Complaint  Patient presents with   Nail Problem    I have an ingrown toenail on my left big toe.    28 y.o. male presents with the above complaint. History confirmed with patient.   Objective:  Physical Exam: warm, good capillary refill, no trophic changes or ulcerative lesions, normal DP and PT pulses, normal sensory exam, and ingrown medial and lateral border left hallux with pain  Assessment:   1. Ingrowing left great toenail      Plan:  Patient was evaluated and treated and all questions answered.     Ingrown Nail, left -Patient elects to proceed with minor surgery to remove ingrown toenail today. Consent reviewed and signed by patient. -Ingrown nail excised. See procedure note. -Educated on post-procedure care including soaking. Written instructions provided and reviewed. -Rx for Cortisporin sent to pharmacy. -Advised on signs and symptoms of infection developing.  We discussed that the phenol likely will create some redness and edema and tenderness around the nailbed as long as it is localized this is to be expected.  Will return as needed if any infection signs develop  Procedure: Excision of Ingrown Toenail Location: Left 1st toe medial and lateral nail borders. Anesthesia: Lidocaine  1% plain; 1.5 mL and Marcaine 0.5% plain; 1.5 mL, digital block. Skin Prep: Betadine. Dressing: Silvadene; telfa; dry, sterile, compression dressing. Technique: Following skin prep, the toe was exsanguinated and a tourniquet was secured at the base of the toe. The affected nail border was freed, split with a nail splitter, and excised. Chemical matrixectomy was then performed with phenol and irrigated out with alcohol. The tourniquet was then removed and sterile dressing applied. Disposition: Patient tolerated procedure well.    Return if symptoms worsen or fail to improve.
